# Patient Record
Sex: Male | Born: 1955 | Race: White | Hispanic: No | Marital: Married | Smoking: Never smoker
Health system: Southern US, Community
[De-identification: ages and names within clinical notes are randomized; demographics above are authoritative.]

## PROBLEM LIST (undated history)

## (undated) DIAGNOSIS — I10 Essential (primary) hypertension: Secondary | ICD-10-CM

## (undated) DIAGNOSIS — I2510 Atherosclerotic heart disease of native coronary artery without angina pectoris: Secondary | ICD-10-CM

## (undated) HISTORY — PX: OTHER SURGICAL HISTORY: SHX169

---

## 1999-03-10 ENCOUNTER — Emergency Department (HOSPITAL_COMMUNITY): Admission: EM | Admit: 1999-03-10 | Discharge: 1999-03-10 | Payer: Self-pay | Admitting: Emergency Medicine

## 1999-03-11 ENCOUNTER — Emergency Department (HOSPITAL_COMMUNITY): Admission: EM | Admit: 1999-03-11 | Discharge: 1999-03-11 | Payer: Self-pay | Admitting: Emergency Medicine

## 1999-03-12 ENCOUNTER — Emergency Department (HOSPITAL_COMMUNITY): Admission: EM | Admit: 1999-03-12 | Discharge: 1999-03-12 | Payer: Self-pay | Admitting: Emergency Medicine

## 2011-07-01 ENCOUNTER — Emergency Department (HOSPITAL_BASED_OUTPATIENT_CLINIC_OR_DEPARTMENT_OTHER): Payer: 59

## 2011-07-01 ENCOUNTER — Emergency Department (HOSPITAL_BASED_OUTPATIENT_CLINIC_OR_DEPARTMENT_OTHER)
Admission: EM | Admit: 2011-07-01 | Discharge: 2011-07-01 | Disposition: A | Discharge status: Home / Self Care | Payer: 59 | Attending: Emergency Medicine | Admitting: Emergency Medicine

## 2011-07-01 ENCOUNTER — Encounter (HOSPITAL_BASED_OUTPATIENT_CLINIC_OR_DEPARTMENT_OTHER): Payer: Self-pay | Admitting: *Deleted

## 2011-07-01 DIAGNOSIS — Y9311 Activity, swimming: Secondary | ICD-10-CM | POA: Insufficient documentation

## 2011-07-01 DIAGNOSIS — Y9239 Other specified sports and athletic area as the place of occurrence of the external cause: Secondary | ICD-10-CM | POA: Insufficient documentation

## 2011-07-01 DIAGNOSIS — I10 Essential (primary) hypertension: Secondary | ICD-10-CM | POA: Insufficient documentation

## 2011-07-01 DIAGNOSIS — Y92838 Other recreation area as the place of occurrence of the external cause: Secondary | ICD-10-CM | POA: Insufficient documentation

## 2011-07-01 DIAGNOSIS — S92919A Unspecified fracture of unspecified toe(s), initial encounter for closed fracture: Secondary | ICD-10-CM | POA: Insufficient documentation

## 2011-07-01 DIAGNOSIS — IMO0002 Reserved for concepts with insufficient information to code with codable children: Secondary | ICD-10-CM | POA: Insufficient documentation

## 2011-07-01 HISTORY — DX: Essential (primary) hypertension: I10

## 2011-07-01 MED ORDER — OXYCODONE-ACETAMINOPHEN 5-325 MG PO TABS: 2.0000 | ORAL_TABLET | ORAL | Status: AC | PRN

## 2011-07-01 NOTE — ED Provider Notes (Signed)
History   This chart was scribed for Rolan Bucco, MD by Charolett Bumpers . The patient was seen in room MH03/MH03.    CSN: [id number]  Arrival date & time 07/01/11  [place]   First MD Initiated Contact with Patient 07/01/11 1953      Chief Complaint  Patient presents with  . Toe Injury    (Consider location/radiation/quality/duration/timing/severity/associated sxs/prior treatment) HPI Daniel Maldonado is a 56 y.o. male who presents to the Emergency Department complaining of constant, moderate 4th toe pain on the left foot since PTA. Patient states that he was playing around in the pool when he hit his toe on someone else's foot. Patient reports associated pain but denies any other associated symptoms. No modifying factors reported. Patient denies any other injuries. Patient denies any h/o similar injuries.    Past Medical History  Diagnosis Date  . Hypertension     History reviewed. No pertinent past surgical history.  History reviewed. No pertinent family history.  History  Substance Use Topics  . Smoking status: Never Smoker   . Smokeless tobacco: Not on file  . Alcohol Use: Yes      Review of Systems  Constitutional: Negative for fever.  Gastrointestinal: Negative for nausea and vomiting.  Musculoskeletal: Positive for joint swelling and arthralgias.  All other systems reviewed and are negative.    Allergies  Review of patient's allergies indicates no known allergies.  Home Medications   Current Outpatient Rx  Name Route Sig Dispense Refill  . FEXOFENADINE-PSEUDOEPHED ER 60-120 MG PO TB12 Oral Take 1 tablet by mouth 2 (two) times daily. Patient uses this medication for allergy symptoms.    Marland Kitchen FLUTICASONE FUROATE 27.5 MCG/SPRAY NA SUSP Nasal Place 2 sprays into the nose daily.    Marland Kitchen HYDROCHLOROTHIAZIDE 12.5 MG PO TABS Oral Take 12.5 mg by mouth daily.    Marland Kitchen LISINOPRIL 40 MG PO TABS Oral Take 40 mg by mouth daily.    . OXYCODONE-ACETAMINOPHEN 5-325 MG PO TABS  Oral Take 2 tablets by mouth every 4 (four) hours as needed for pain. 15 tablet 0    BP 133/83  Pulse 68  Temp 98.1 F (36.7 C) (Oral)  Resp 20  Ht 5\' 7"  (1.702 m)  Wt 189 lb (85.73 kg)  BMI 29.60 kg/m2  SpO2 98%  Physical Exam  Nursing note and vitals reviewed. Constitutional: He is oriented to person, place, and time. He appears well-developed and well-nourished. No distress.  HENT:  Head: Normocephalic and atraumatic.  Eyes: Pupils are equal, round, and reactive to light.  Neck: Neck supple.  Cardiovascular: Normal rate.   Pulmonary/Chest: Effort normal. No respiratory distress.  Musculoskeletal: Normal range of motion. He exhibits no edema.       On left foot, deformity of the 4th digit to phalanx. No pain of metatarsals or ankle. Cap refill <2 seconds. Good pulses. Neurovascularly intact. Sensation intact.   Neurological: He is alert and oriented to person, place, and time. No sensory deficit.  Skin: Skin is warm and dry. No rash noted.  Psychiatric: He has a normal mood and affect. His behavior is normal.    ED Course  Reduction of fracture Date/Time: 07/01/2011 8:32 PM Performed by: Ji Feldner Authorized by: Rolan Bucco Consent: Verbal consent obtained. Risks and benefits: risks, benefits and alternatives were discussed Consent given by: patient Local anesthesia used: no Patient sedated: no Patient tolerance: Patient tolerated the procedure well with no immediate complications. Comments: Pt did not want local anesthesia.  Toe  was slightly angulated.  Straightened toe with good apparent alignment, buddy taped, post op shoe given   (including critical care time)  DIAGNOSTIC STUDIES: Oxygen Saturation is 98% on room air, normal by my interpretation.    COORDINATION OF CARE:  2003: Discussed planned course of treatment with the patient, who is agreeable at this time. Discussed imaging results and f/u with orthopedic.     Labs Reviewed - No data to  display Dg Toe 4th Left  07/01/2011  *RADIOLOGY REPORT*  Clinical Data: Injured fourth toe.  LEFT FOURTH TOE  Comparison: None  Findings: There is a mildly displaced fracture involving the proximal phalanx.  The joint spaces are maintained.  No other fractures are seen.  IMPRESSION: Mildly displaced proximal phalanx fracture.  Original Report Authenticated By: P. Loralie Champagne, M.D.     1. Phalanx fracture, foot       MDM  Pt to f/u with ortho in [facility]    I personally performed the services described in this documentation, which was scribed in my presence.  The recorded information has been reviewed and considered.      Rolan Bucco, MD 07/01/11 2034

## 2011-07-01 NOTE — ED Notes (Signed)
Pt reports no loss of sensation.  Pt denies any other associated trauma.  Toe is warm to touch with obvious deformity.  Pt instructed to remain NPO

## 2011-07-01 NOTE — ED Notes (Signed)
Pt states he was playing around in the pool with his nephew and ? Dislocated his left 4th toe. Obvious deformity noted. Unable to feel touch, but cap refill is< 3 sec.

## 2011-07-01 NOTE — Discharge Instructions (Signed)
Toe Fracture  Your caregiver has diagnosed you as having a fractured toe. A toe fracture is a break in the bone of a toe. "Buddy taping" is a way of splinting your broken toe, by taping the broken toe to the toe next to it. This "buddy taping" will keep the injured toe from moving beyond normal range of motion. Buddy taping also helps the toe heal in a more normal alignment. It may take 6 to 8 weeks for the toe injury to heal.  HOME CARE INSTRUCTIONS   · Leave your toes taped together for as long as directed by your caregiver or until you see a doctor for a follow-up examination. You can change the tape after bathing. Always use a small piece of gauze or cotton between the toes when taping them together. This will help the skin stay dry and prevent infection.  · Apply ice to the injury for 15 to 20 minutes each hour while awake for the first 2 days. Put the ice in a plastic bag and place a towel between the bag of ice and your skin.  · After the first 2 days, apply heat to the injured area. Use heat for the next 2 to 3 days. Place a heating pad on the foot or soak the foot in warm water as directed by your caregiver.  · Keep your foot elevated as much as possible to lessen swelling.  · Wear sturdy, supportive shoes. The shoes should not pinch the toes or fit tightly against the toes.  · Your caregiver may prescribe a rigid shoe if your foot is very swollen.  · Your may be given crutches if the pain is too great and it hurts too much to walk.  · Only take over-the-counter or prescription medicines for pain, discomfort, or fever as directed by your caregiver.  · If your caregiver has given you a follow-up appointment, it is very important to keep that appointment. Not keeping the appointment could result in a chronic or permanent injury, pain, and disability. If there is any problem keeping the appointment, you must call back to this facility for assistance.  SEEK MEDICAL CARE IF:   · You have increased pain or  swelling, not relieved with medications.  · The pain does not get better after 1 week.  · Your injured toe is cold when the others are warm.  SEEK IMMEDIATE MEDICAL CARE IF:   · The toe becomes cold, numb, or white.  · The toe becomes hot (inflamed) and red.  Document Released: 12/23/1999 Document Revised: 12/14/2010 Document Reviewed: 08/11/2007  [employer]® Patient Information ©2012 [employer].

## 2013-06-21 IMAGING — CR DG TOE 4TH 2+V*L*
3 series · 3 of 3 positions shown · non-contrast
Comparison: None

CLINICAL DATA: Injured fourth toe.

LEFT FOURTH TOE

[t toes ap left]
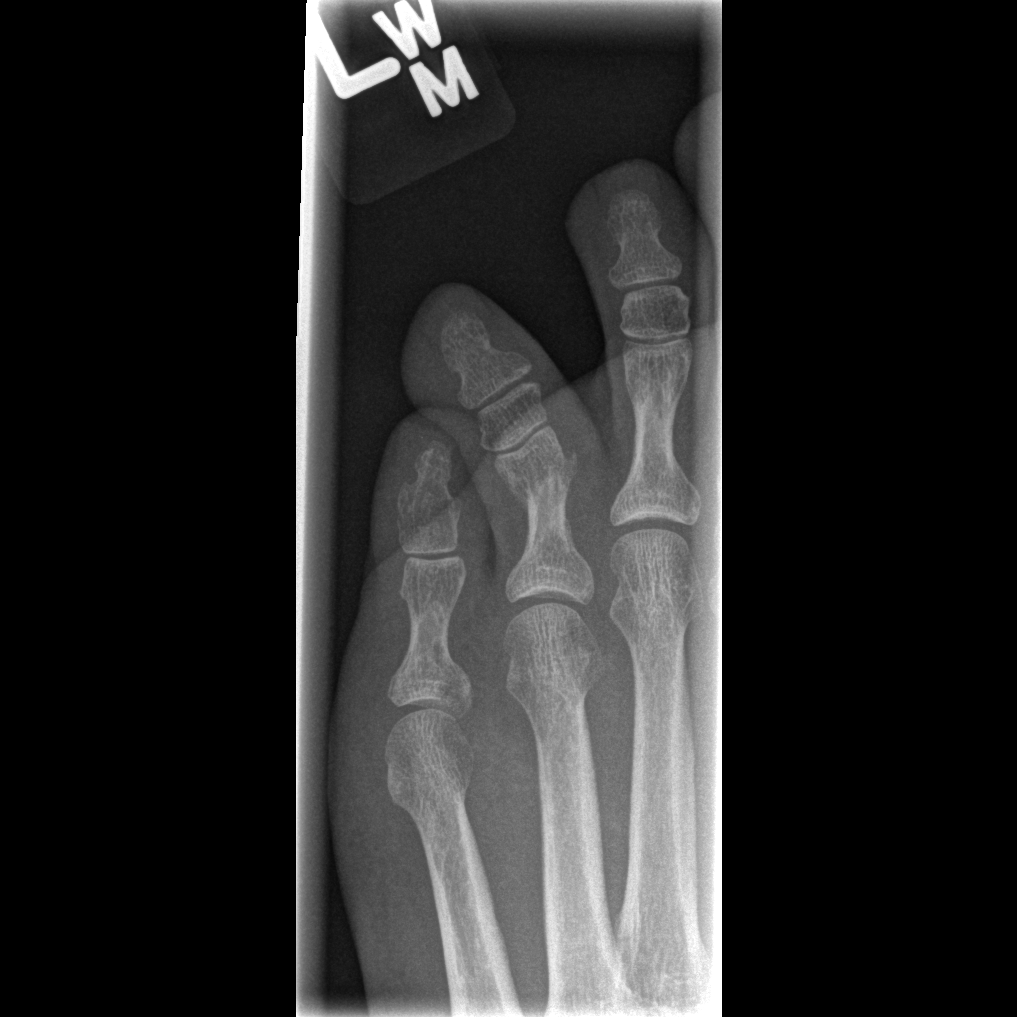

[t toes oblique left]
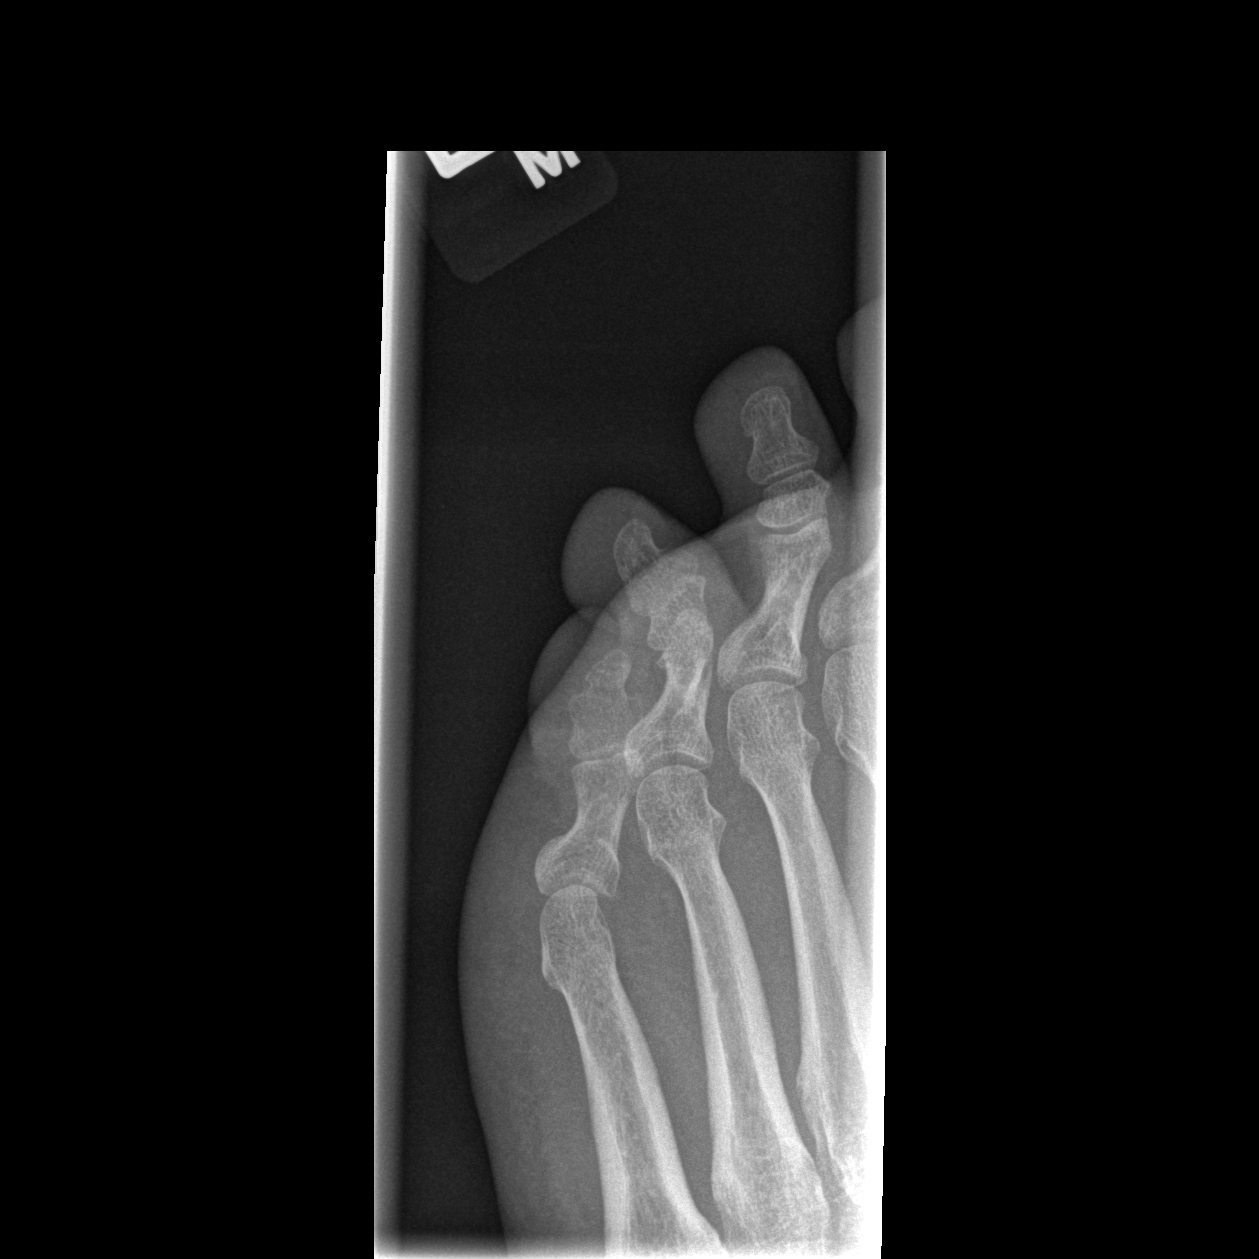

[t toes lateral left]
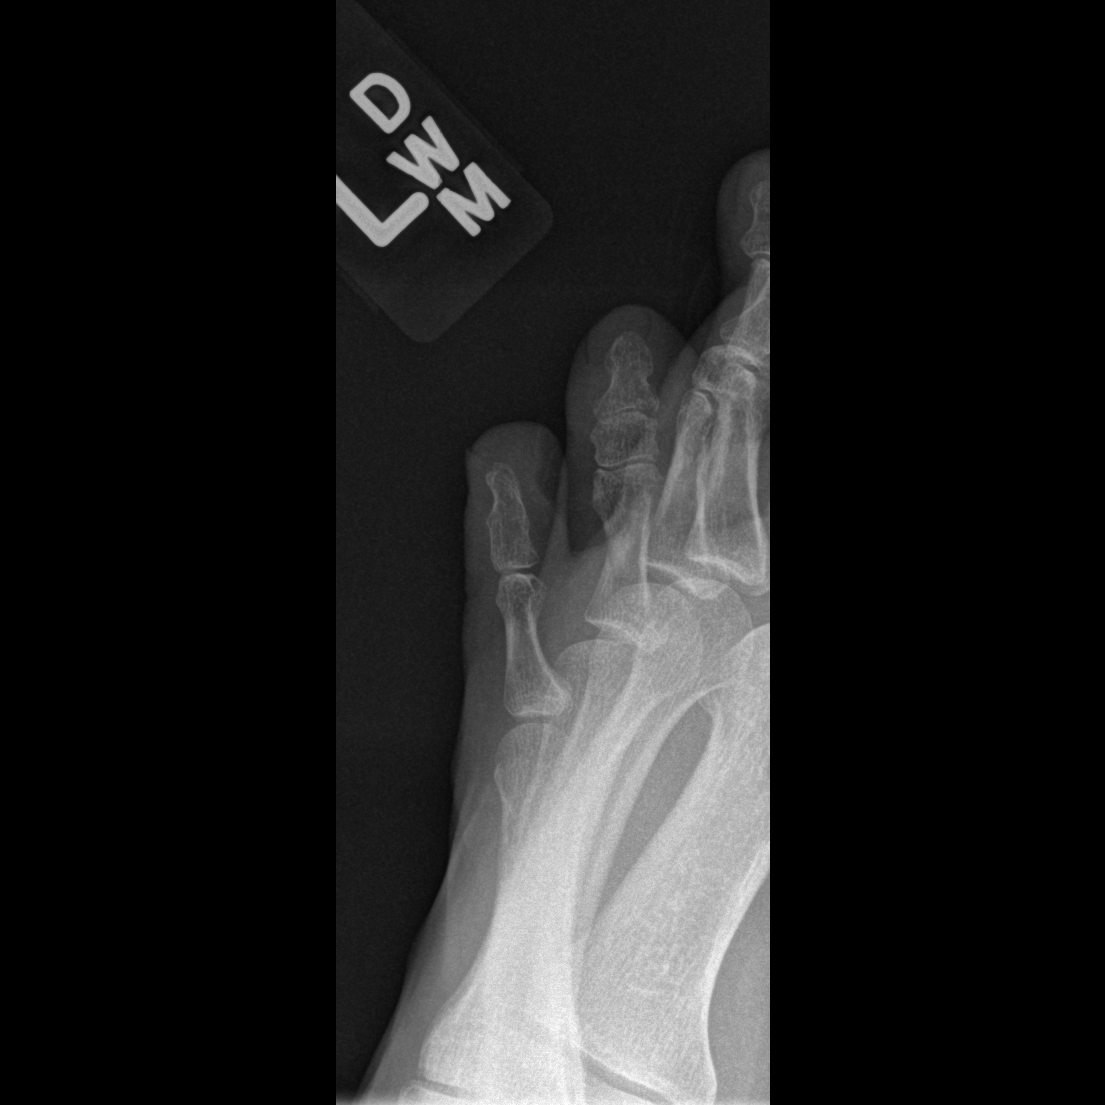

[3 of 3 positions shown; findings below may reference images not displayed]

FINDINGS: There is a mildly displaced fracture involving the
proximal phalanx.  The joint spaces are maintained.  No other
fractures are seen.
IMPRESSION: Mildly displaced proximal phalanx fracture.

## 2014-09-23 ENCOUNTER — Emergency Department (HOSPITAL_COMMUNITY): Payer: 59

## 2014-09-23 ENCOUNTER — Emergency Department (HOSPITAL_COMMUNITY)
Admission: EM | Admit: 2014-09-23 | Discharge: 2014-09-23 | Disposition: A | Discharge status: Home / Self Care | Payer: 59 | Attending: Emergency Medicine | Admitting: Emergency Medicine

## 2014-09-23 ENCOUNTER — Encounter (HOSPITAL_COMMUNITY): Payer: Self-pay | Admitting: *Deleted

## 2014-09-23 DIAGNOSIS — I252 Old myocardial infarction: Secondary | ICD-10-CM | POA: Diagnosis not present

## 2014-09-23 DIAGNOSIS — Z7982 Long term (current) use of aspirin: Secondary | ICD-10-CM | POA: Diagnosis not present

## 2014-09-23 DIAGNOSIS — R079 Chest pain, unspecified: Secondary | ICD-10-CM | POA: Insufficient documentation

## 2014-09-23 DIAGNOSIS — Z7951 Long term (current) use of inhaled steroids: Secondary | ICD-10-CM | POA: Insufficient documentation

## 2014-09-23 DIAGNOSIS — Z9889 Other specified postprocedural states: Secondary | ICD-10-CM | POA: Insufficient documentation

## 2014-09-23 DIAGNOSIS — I10 Essential (primary) hypertension: Secondary | ICD-10-CM | POA: Diagnosis not present

## 2014-09-23 DIAGNOSIS — R001 Bradycardia, unspecified: Secondary | ICD-10-CM | POA: Diagnosis not present

## 2014-09-23 DIAGNOSIS — R61 Generalized hyperhidrosis: Secondary | ICD-10-CM | POA: Diagnosis not present

## 2014-09-23 DIAGNOSIS — I2510 Atherosclerotic heart disease of native coronary artery without angina pectoris: Secondary | ICD-10-CM | POA: Diagnosis not present

## 2014-09-23 DIAGNOSIS — Z79899 Other long term (current) drug therapy: Secondary | ICD-10-CM | POA: Diagnosis not present

## 2014-09-23 HISTORY — DX: Atherosclerotic heart disease of native coronary artery without angina pectoris: I25.10

## 2014-09-23 LAB — BASIC METABOLIC PANEL
ANION GAP: 6 (ref 5–15)
BUN: 17 mg/dL (ref 6–20)
CALCIUM: 8.3 mg/dL — AB (ref 8.9–10.3)
CO2: 26 mmol/L (ref 22–32)
CREATININE: 0.88 mg/dL (ref 0.61–1.24)
Chloride: 99 mmol/L — ABNORMAL LOW (ref 101–111)
GLUCOSE: 114 mg/dL — AB (ref 65–99)
Potassium: 4.2 mmol/L (ref 3.5–5.1)
Sodium: 131 mmol/L — ABNORMAL LOW (ref 135–145)

## 2014-09-23 LAB — CBC
HCT: 36.6 % — ABNORMAL LOW (ref 39.0–52.0)
HEMOGLOBIN: 12.7 g/dL — AB (ref 13.0–17.0)
MCH: 32.2 pg (ref 26.0–34.0)
MCHC: 34.7 g/dL (ref 30.0–36.0)
MCV: 92.7 fL (ref 78.0–100.0)
PLATELETS: 244 10*3/uL (ref 150–400)
RBC: 3.95 MIL/uL — AB (ref 4.22–5.81)
RDW: 12.2 % (ref 11.5–15.5)
WBC: 7.6 10*3/uL (ref 4.0–10.5)

## 2014-09-23 LAB — TROPONIN I

## 2014-09-23 NOTE — ED Provider Notes (Signed)
CSN: [id number]     Arrival date & time 09/23/14  1958 History   First MD Initiated Contact with Patient 09/23/14 2009     Chief Complaint  Patient presents with  . Chest Pain     (Consider location/radiation/quality/duration/timing/severity/associated sxs/prior Treatment) HPI Comments: 59 year old male with a history of cardiac disease, not currently on anticoagulation at his cardiologist's request according to the patient. He states that he had a heart attack in May of last year, in June following that he had an evaluation and was seen at the hospital where he had his heart catheterization at [facility]. It was found that he had normal CAT scan, he had a stress test in January and states that it was totally normal as well. He has continued to have persistent sharp sided chest pain when he becomes agitated or anxious at work. This occurred again today when he became worried about his job, he has some radiation to the left arm which he always does and some diaphoresis which made him concerned. Prior to this he had been exercising at the gym for some time and both on the treadmill and the elliptical machine with cardiovascular exercise and activity he had no chest pain whatsoever. Currently the symptoms are mild and improved  Patient is a 59 y.o. male presenting with chest pain. The history is provided by the patient.  Chest Pain   Past Medical History  Diagnosis Date  . Hypertension   . Coronary artery disease    Past Surgical History  Procedure Laterality Date  . Stents     History reviewed. No pertinent family history. Social History  Substance Use Topics  . Smoking status: Never Smoker   . Smokeless tobacco: None  . Alcohol Use: Yes    Review of Systems  Cardiovascular: Positive for chest pain.  All other systems reviewed and are negative.     Allergies  Review of patient's allergies indicates no known allergies.  Home Medications   Prior to Admission  medications   Medication Sig Start Date End Date Taking? Authorizing Provider  aspirin EC 81 MG tablet Take 81 mg by mouth daily.   Yes Historical Provider, MD  atorvastatin (LIPITOR) 40 MG tablet Take 40 mg by mouth daily. 06/17/14  Yes Historical Provider, MD  carvedilol (COREG) 6.25 MG tablet Take 6.25 mg by mouth 2 (two) times daily with a meal.   Yes Historical Provider, MD  fluticasone (FLONASE) 50 MCG/ACT nasal spray Place 2 sprays into both nostrils daily. 09/20/14  Yes Historical Provider, MD  hydrochlorothiazide (HYDRODIURIL) 12.5 MG tablet Take 12.5 mg by mouth daily.   Yes Historical Provider, MD  lisinopril (PRINIVIL,ZESTRIL) 40 MG tablet Take 40 mg by mouth daily.   Yes Historical Provider, MD  nitroGLYCERIN (NITROSTAT) 0.4 MG SL tablet Place 0.4 mg under the tongue every 5 (five) minutes as needed for chest pain.   Yes Historical Provider, MD   BP 110/61 mmHg  Pulse 49  Resp 23  SpO2 98% Physical Exam  Constitutional: He appears well-developed and well-nourished. No distress.  HENT:  Head: Normocephalic and atraumatic.  Mouth/Throat: Oropharynx is clear and moist. No oropharyngeal exudate.  Eyes: Conjunctivae and EOM are normal. Pupils are equal, round, and reactive to light. Right eye exhibits no discharge. Left eye exhibits no discharge. No scleral icterus.  Neck: Normal range of motion. Neck supple. No JVD present. No thyromegaly present.  Cardiovascular: Normal rate, regular rhythm, normal heart sounds and intact distal pulses.  Exam reveals no gallop and no friction rub.   No murmur heard. Pulmonary/Chest: Effort normal and breath sounds normal. No respiratory distress. He has no wheezes. He has no rales. He exhibits no tenderness.  Abdominal: Soft. Bowel sounds are normal. He exhibits no distension and no mass. There is no tenderness.  Musculoskeletal: Normal range of motion. He exhibits no edema or tenderness.  Lymphadenopathy:    He has no cervical adenopathy.   Neurological: He is alert. Coordination normal.  Skin: Skin is warm and dry. No rash noted. No erythema.  Psychiatric: He has a normal mood and affect. His behavior is normal.  Nursing note and vitals reviewed.   ED Course  Procedures (including critical care time) Labs Review Labs Reviewed  BASIC METABOLIC PANEL - Abnormal; Notable for the following:    Sodium 131 (*)    Chloride 99 (*)    Glucose, Bld 114 (*)    Calcium 8.3 (*)    All other components within normal limits  CBC - Abnormal; Notable for the following:    RBC 3.95 (*)    Hemoglobin 12.7 (*)    HCT 36.6 (*)    All other components within normal limits  TROPONIN I    Imaging Review Dg Chest 2 View  09/23/2014   CLINICAL DATA:  Left-sided chest pain for 4 hours  EXAM: CHEST  2 VIEW  COMPARISON:  None.  FINDINGS: The heart size and mediastinal contours are within normal limits. Both lungs are clear. The visualized skeletal structures are unremarkable.  IMPRESSION: No active cardiopulmonary disease.   Electronically Signed   By: Christiana Pellant M.D.   On: 09/23/2014 21:13   I have personally reviewed and evaluated these images and lab results as part of my medical decision-making.   EKG Interpretation   Date/Time:  Thursday September 23 2014 19:59:31 EDT Ventricular Rate:  54 PR Interval:  215 QRS Duration: 104 QT Interval:  422 QTC Calculation: 400 R Axis:   46 Text Interpretation:  Sinus bradycardia Prolonged PR interval No old  tracing to compare Confirmed by Billye Pickerel  MD, Refugio Mcconico ([id number]) on 09/23/2014  8:14:48 PM      MDM   Final diagnoses:  Chest pain, unspecified chest pain type    The patient appears calm, he has a totally normal exam, his EKG shows mild sinus bradycardia but no other acute findings, obtain troponin, doubt cardiac source as he has this every day and is able to exercise without pain earlier in the day.  Pt has normal labs - troponin pending at time of change of shift - care signed  over to Dr. Deretha Emory pending troponin.  Eber Hong, MD 09/23/14 2207

## 2014-09-23 NOTE — ED Notes (Signed)
Pt reporting chest pain on left side.  Pt reports pain into left shoulder and down left arm.  Denies nausea or SOB.  Reports diaphoresis.   ASA and 1 SL nitro given by EMS,.  Pt reporting some relief from NTG.

## 2014-09-23 NOTE — ED Provider Notes (Signed)
Troponin was negative and not consistent with any acute cardiac event. Patient will be discharged home.  Vanetta Mulders, MD 09/23/14 2237

## 2014-09-23 NOTE — Discharge Instructions (Signed)

## 2016-09-13 IMAGING — DX DG CHEST 2V
3 series · 3 of 3 positions shown · non-contrast
Comparison: None.

CLINICAL DATA: Left-sided chest pain for 4 hours

EXAM:
CHEST  2 VIEW

[chest pa]
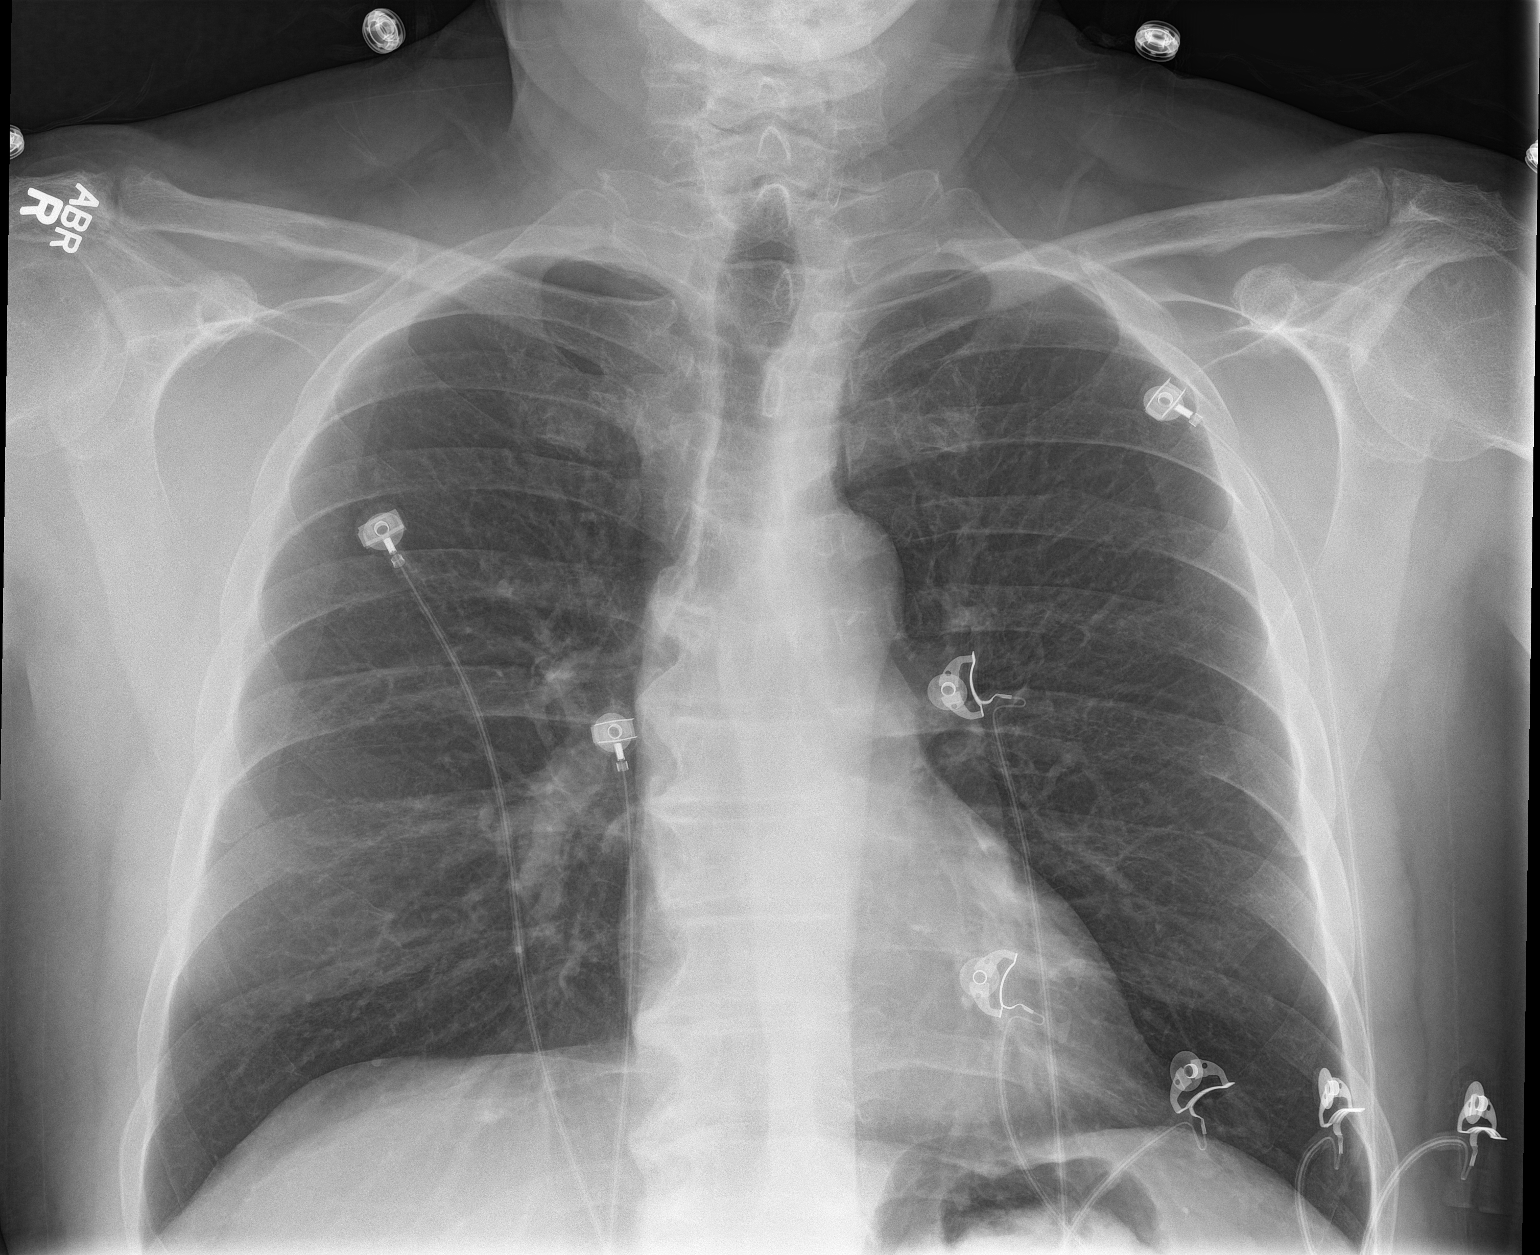

[chest lat (1 of 2)]
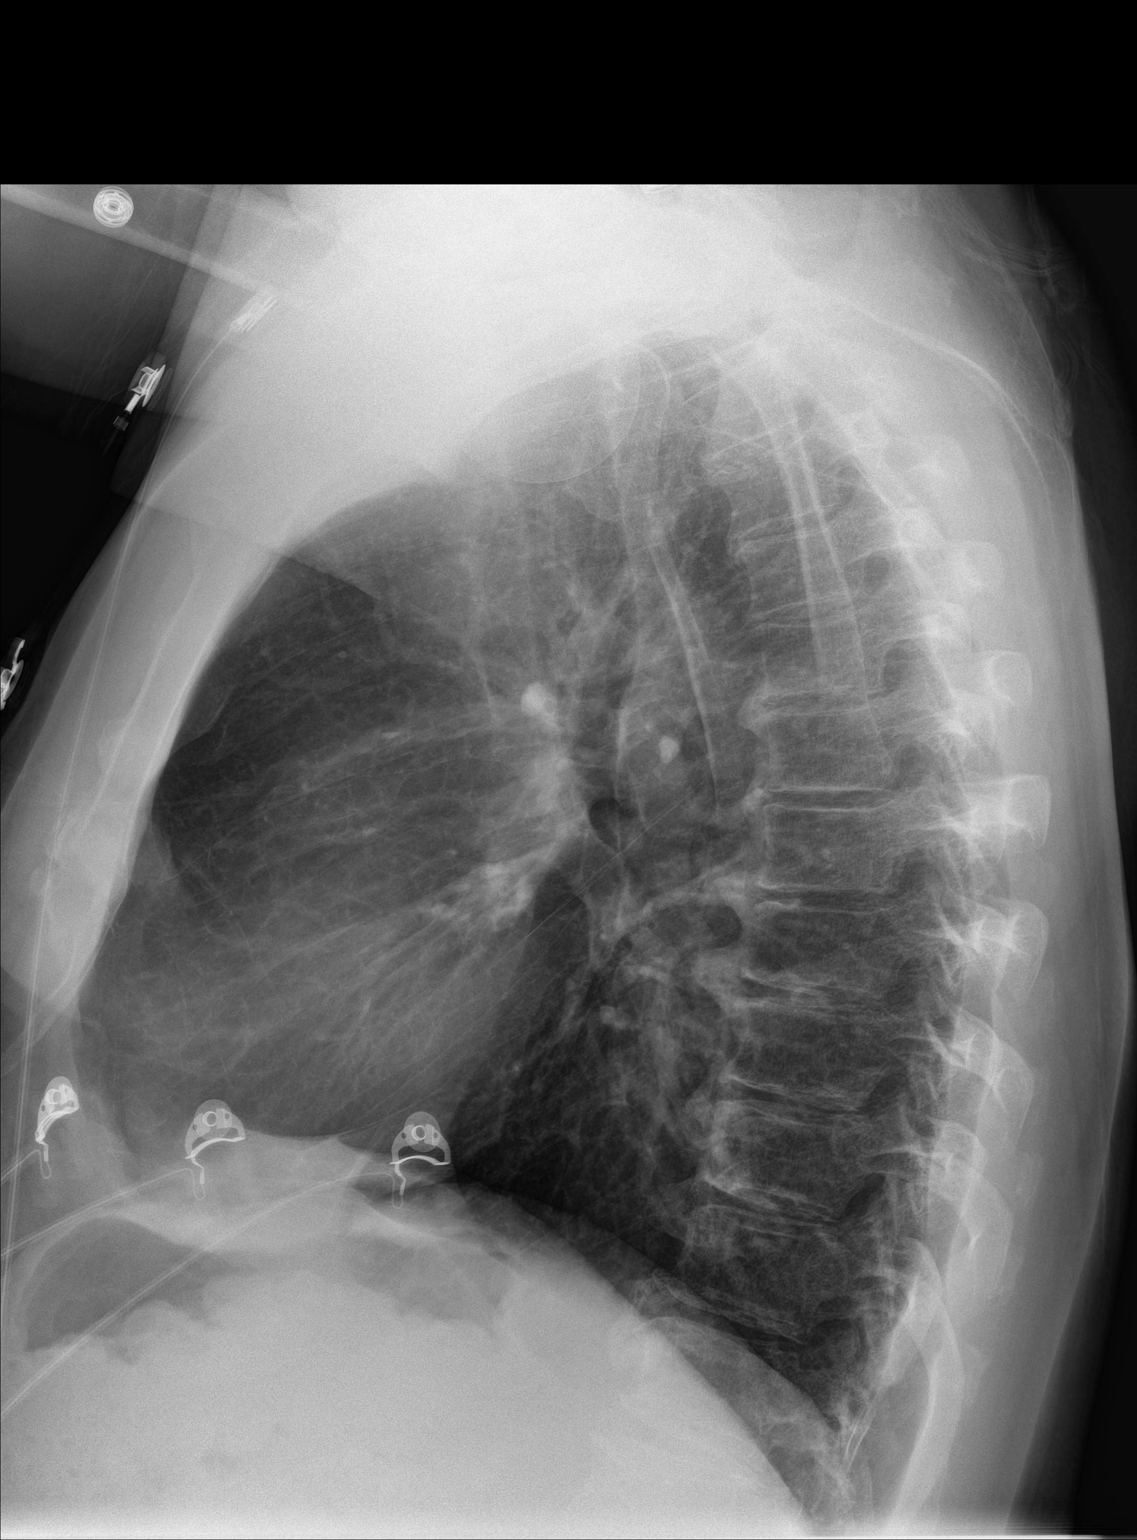

[chest lat (2 of 2)]
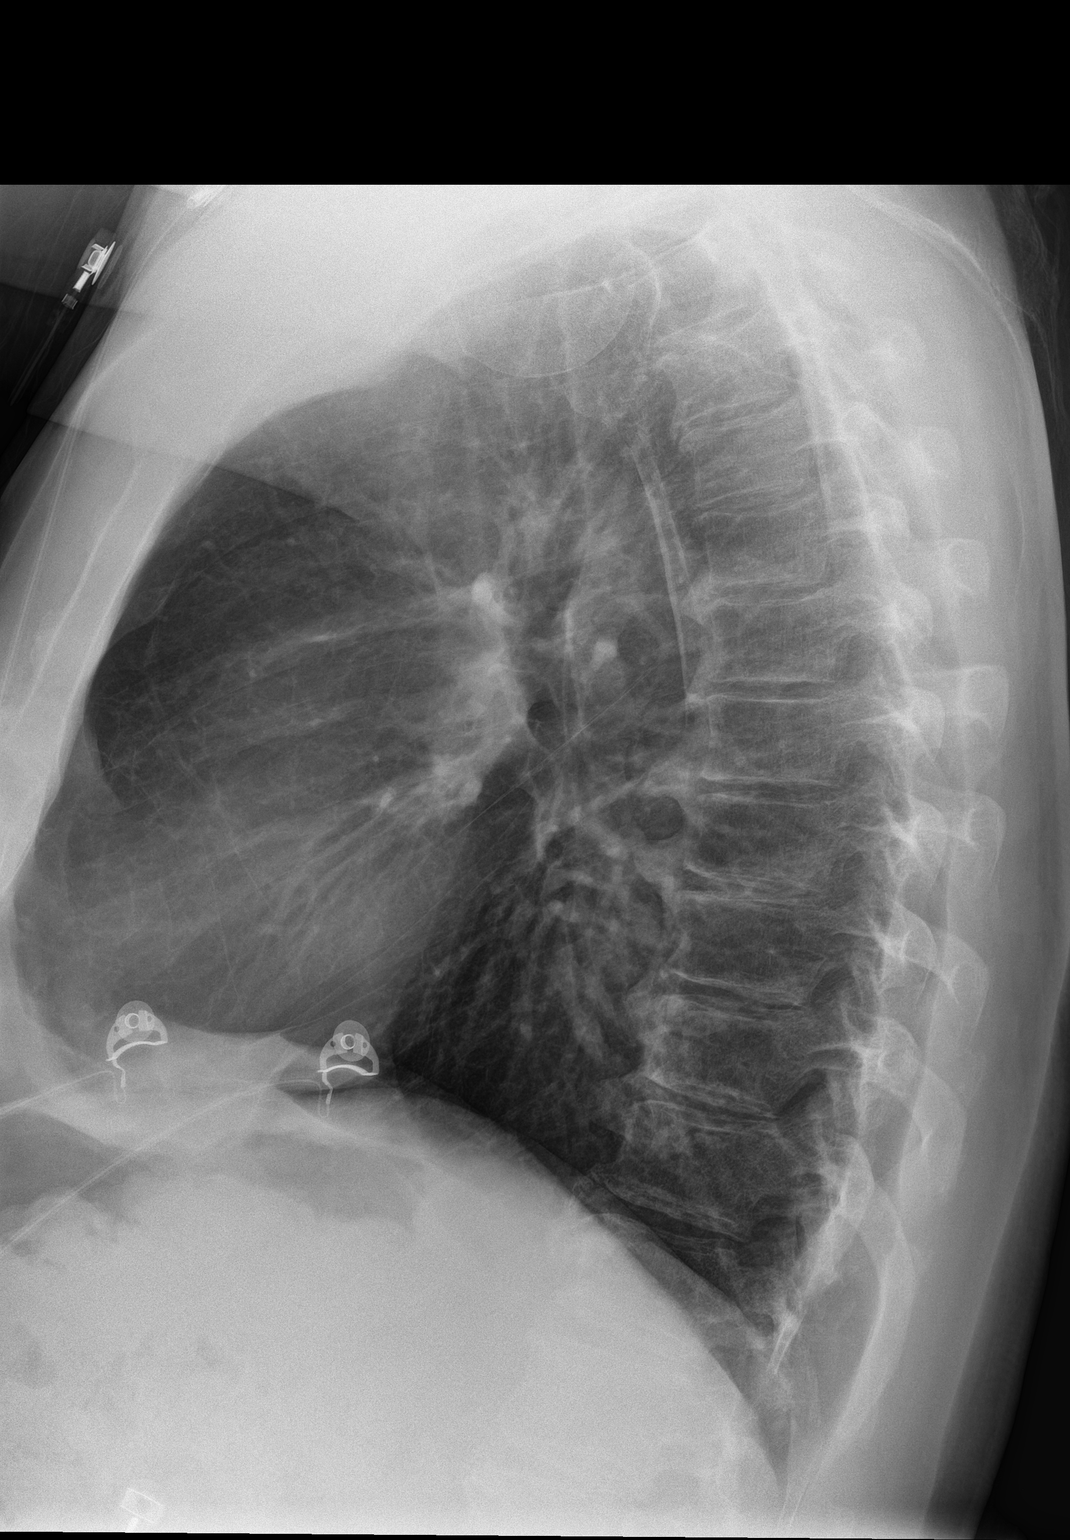

[3 of 3 positions shown; findings below may reference images not displayed]

FINDINGS: The heart size and mediastinal contours are within normal limits.
Both lungs are clear. The visualized skeletal structures are
unremarkable.
IMPRESSION: No active cardiopulmonary disease.
